# Patient Record
Sex: Male | Born: 1965 | Race: White | Hispanic: No | Marital: Married | Smoking: Former smoker
Health system: Southern US, Community
[De-identification: ages and names within clinical notes are randomized; demographics above are authoritative.]

## PROBLEM LIST (undated history)

## (undated) DIAGNOSIS — K219 Gastro-esophageal reflux disease without esophagitis: Secondary | ICD-10-CM

## (undated) DIAGNOSIS — I2510 Atherosclerotic heart disease of native coronary artery without angina pectoris: Secondary | ICD-10-CM

## (undated) DIAGNOSIS — I471 Supraventricular tachycardia, unspecified: Secondary | ICD-10-CM

## (undated) DIAGNOSIS — IMO0001 Reserved for inherently not codable concepts without codable children: Secondary | ICD-10-CM

## (undated) DIAGNOSIS — R030 Elevated blood-pressure reading, without diagnosis of hypertension: Secondary | ICD-10-CM

## (undated) DIAGNOSIS — E785 Hyperlipidemia, unspecified: Secondary | ICD-10-CM

## (undated) HISTORY — DX: Gastro-esophageal reflux disease without esophagitis: K21.9

## (undated) HISTORY — DX: Supraventricular tachycardia, unspecified: I47.10

## (undated) HISTORY — DX: Hyperlipidemia, unspecified: E78.5

## (undated) HISTORY — DX: Elevated blood-pressure reading, without diagnosis of hypertension: R03.0

## (undated) HISTORY — DX: Reserved for inherently not codable concepts without codable children: IMO0001

## (undated) HISTORY — DX: Supraventricular tachycardia: I47.1

## (undated) HISTORY — DX: Atherosclerotic heart disease of native coronary artery without angina pectoris: I25.10

---

## 1992-10-08 HISTORY — PX: CYSTOSCOPY W/ TRANSURETHRAL RESECTION OF POSTERIOR URETHERAL VALVES: SHX1428

## 1994-10-08 HISTORY — PX: HERNIA REPAIR: SHX51

## 2008-04-26 ENCOUNTER — Encounter: Payer: Self-pay | Admitting: Cardiology

## 2008-05-07 ENCOUNTER — Encounter: Payer: Self-pay | Admitting: Cardiology

## 2008-05-19 ENCOUNTER — Encounter: Payer: Self-pay | Admitting: Cardiology

## 2009-03-04 ENCOUNTER — Encounter: Payer: Self-pay | Admitting: Cardiology

## 2010-11-07 ENCOUNTER — Encounter: Payer: Self-pay | Admitting: Cardiology

## 2010-11-21 ENCOUNTER — Encounter: Payer: Self-pay | Admitting: Cardiology

## 2010-11-30 ENCOUNTER — Encounter: Payer: Self-pay | Admitting: Cardiology

## 2011-01-02 ENCOUNTER — Encounter: Payer: Self-pay | Admitting: Cardiology

## 2011-10-11 ENCOUNTER — Encounter: Payer: Self-pay | Admitting: *Deleted

## 2011-10-12 ENCOUNTER — Ambulatory Visit (INDEPENDENT_AMBULATORY_CARE_PROVIDER_SITE_OTHER): Payer: 59 | Admitting: Cardiology

## 2011-10-12 ENCOUNTER — Encounter: Payer: Self-pay | Admitting: Cardiology

## 2011-10-12 VITALS — BP 120/79 | HR 80 | Ht 73.0 in | Wt 250.0 lb

## 2011-10-12 DIAGNOSIS — I2510 Atherosclerotic heart disease of native coronary artery without angina pectoris: Secondary | ICD-10-CM

## 2011-10-12 DIAGNOSIS — E785 Hyperlipidemia, unspecified: Secondary | ICD-10-CM | POA: Insufficient documentation

## 2011-10-12 NOTE — Assessment & Plan Note (Signed)
Plan check lipids. Initiate therapy if needed.

## 2011-10-12 NOTE — Patient Instructions (Signed)
Your physician wants you to follow-up in: ONE YEAR You will receive a reminder letter in the mail two months in advance. If you don't receive a letter, please call our office to schedule the follow-up appointment.   Your physician recommends that you return for lab work in: WHEN ABLE= FASTING

## 2011-10-12 NOTE — Progress Notes (Signed)
  HPI: 46 year old male for establishment and management of hyperlipidemia, CAD and palpitations. Previously cared for in [place]. Previous echocardiogram in July of 2009 showed normal LV function. There was trace mitral and tricuspid regurgitation. Exercise treadmill in February of 2012 was normal. Cardiac CT in June of 2010 showed a score of 59.82. There was mild calcium in the LAD. The patient recently moved to [facility] and presents for evaluation. He denies dyspnea on exertion, orthopnea, PND, pedal edema, palpitations, syncope or exertional chest pain.  Current Outpatient Prescriptions  Medication Sig Dispense Refill  . aspirin 81 MG tablet Take 160 mg by mouth daily.        . Cholecalciferol (VITAMIN D-3 PO) Take 1 capsule by mouth daily.        . Flaxseed, Linseed, (FLAX SEEDS PO) Take 1 capsule by mouth daily.        . multivitamin (THERAGRAN) per tablet Take 1 tablet by mouth daily.        . Pyridoxine HCl (VITAMIN B6 PO) Take 1 tablet by mouth daily.          No Known Allergies  Past Medical History  Diagnosis Date  . High cholesterol   . CAD (coronary artery disease)     Mild calcium in LAD on previous CT  . GERD (gastroesophageal reflux disease)     Past Surgical History  Procedure Date  . Hernia repair 1996    Umbilical  . Cystoscopy w/ transurethral resection of posterior uretheral valves 1994    History   Social History  . Marital Status: Married    Spouse Name: N/A    Number of Children: 2  . Years of Education: N/A   Occupational History  .      Finance   Social History Main Topics  . Smoking status: Former Smoker    Types: Cigarettes    Quit date: 10/11/2004  . Smokeless tobacco: Former Neurosurgeon    Types: Snuff, Chew    Quit date: 10/11/2001  . Alcohol Use: Yes  . Drug Use: Not on file  . Sexually Active: Not on file   Other Topics Concern  . Not on file   Social History Narrative  . No narrative on file    Family History  Problem  Relation Age of Onset  . Hypertension Mother   . Thyroid disease Mother   . Coronary artery disease Father     Died at 62 of MI    ROS:  no fevers or chills, productive cough, hemoptysis, dysphasia, odynophagia, melena, hematochezia, dysuria, hematuria, rash, seizure activity, orthopnea, PND, pedal edema, claudication. Remaining systems are negative.  Physical Exam:  Blood pressure 120/79, pulse 80, height 6\' 1"  (1.854 m), weight 250 lb (113.399 kg).  General:  Well developed/well nourished in NAD Skin warm/dry Patient not depressed No peripheral clubbing Back-normal HEENT-normal/normal eyelids Neck supple/normal carotid upstroke bilaterally; no bruits; no JVD; no thyromegaly chest - CTA/ normal expansion CV - RRR/normal S1 and S2; no murmurs, rubs or gallops;  PMI nondisplaced Abdomen -NT/ND, no HSM, no mass, + bowel sounds, no bruit 2+ femoral pulses, no bruits Ext-no edema, chords, 2+ DP Neuro-grossly nonfocal  ECG NSR with no ST changes

## 2011-10-12 NOTE — Assessment & Plan Note (Signed)
Patient with no cardiac symptoms. Previous cardiac CT showed mild calcium in the LAD. Continue aspirin. Note recent exercise treadmill negative. Previous echo showed normal LV function. We discussed the importance of risk factor modification including diet, weight loss and exercise. He does not smoke.

## 2011-10-16 ENCOUNTER — Other Ambulatory Visit: Payer: Self-pay | Admitting: Cardiology

## 2011-10-16 ENCOUNTER — Other Ambulatory Visit (INDEPENDENT_AMBULATORY_CARE_PROVIDER_SITE_OTHER): Payer: 59 | Admitting: *Deleted

## 2011-10-16 DIAGNOSIS — I2510 Atherosclerotic heart disease of native coronary artery without angina pectoris: Secondary | ICD-10-CM

## 2011-10-16 LAB — LIPID PANEL
Cholesterol: 280 mg/dL — ABNORMAL HIGH (ref 0–200)
Triglycerides: 148 mg/dL (ref 0.0–149.0)

## 2011-10-17 ENCOUNTER — Telehealth: Payer: Self-pay | Admitting: *Deleted

## 2011-10-17 DIAGNOSIS — E7800 Pure hypercholesterolemia, unspecified: Secondary | ICD-10-CM

## 2011-10-17 DIAGNOSIS — Z79899 Other long term (current) drug therapy: Secondary | ICD-10-CM

## 2011-10-17 MED ORDER — ROSUVASTATIN CALCIUM 40 MG PO TABS: 40.0000 mg | ORAL_TABLET | Freq: Every day | ORAL | Status: DC

## 2011-10-17 NOTE — Telephone Encounter (Signed)
Message copied by Freddi Starr on Wed Oct 17, 2011  8:43 AM ------      Message from: Lewayne Bunting      Created: Tue Oct 16, 2011  1:47 PM       Add crestor 40 mg daily; lipids and liver in six weeks      Daniel Koch

## 2011-11-05 ENCOUNTER — Telehealth: Payer: Self-pay | Admitting: Cardiology

## 2011-11-05 DIAGNOSIS — E7800 Pure hypercholesterolemia, unspecified: Secondary | ICD-10-CM

## 2011-11-05 DIAGNOSIS — Z79899 Other long term (current) drug therapy: Secondary | ICD-10-CM

## 2011-11-05 NOTE — Telephone Encounter (Signed)
Spoke with pt, he is having insomnia since starting the crestor. He would like to change back to lipitor, he has taken before with no problems. Will forward for dr Jens Som review

## 2011-11-05 NOTE — Telephone Encounter (Signed)
New Problem:    Patient called in wondering if he could switch his rosuvastatin (CRESTOR) 40 MG tablet to another medication because he is experiencing some weird side effects. Please call back.

## 2011-11-05 NOTE — Telephone Encounter (Signed)
Dc crestor; lipitor 40 mg daily; lipids and liver in six weeks Deshan Claude Waldman  

## 2011-11-06 MED ORDER — ATORVASTATIN CALCIUM 40 MG PO TABS: 40.0000 mg | ORAL_TABLET | Freq: Every day | ORAL | Status: DC

## 2011-11-06 NOTE — Telephone Encounter (Signed)
Spoke with pt, aware of med change. Labs to be done on 12-18-11

## 2011-12-05 ENCOUNTER — Other Ambulatory Visit: Payer: 59 | Admitting: *Deleted

## 2011-12-18 ENCOUNTER — Other Ambulatory Visit: Payer: 59

## 2011-12-18 DIAGNOSIS — E7800 Pure hypercholesterolemia, unspecified: Secondary | ICD-10-CM

## 2011-12-18 DIAGNOSIS — Z79899 Other long term (current) drug therapy: Secondary | ICD-10-CM

## 2011-12-18 LAB — LIPID PANEL
Cholesterol: 128 mg/dL (ref 0–200)
LDL Cholesterol: 59 mg/dL (ref 0–99)
Total CHOL/HDL Ratio: 2.5 Ratio
VLDL: 18 mg/dL (ref 0–40)

## 2011-12-19 ENCOUNTER — Encounter: Payer: Self-pay | Admitting: *Deleted

## 2011-12-19 LAB — HEPATIC FUNCTION PANEL
ALT: 28 U/L (ref 0–53)
AST: 26 U/L (ref 0–37)
Bilirubin, Direct: 0.3 mg/dL (ref 0.0–0.3)
Indirect Bilirubin: 0 mg/dL (ref 0.0–0.9)
Total Bilirubin: 0.3 mg/dL (ref 0.3–1.2)

## 2012-01-02 ENCOUNTER — Other Ambulatory Visit: Payer: Self-pay | Admitting: *Deleted

## 2012-01-02 MED ORDER — ATORVASTATIN CALCIUM 40 MG PO TABS: 40.0000 mg | ORAL_TABLET | Freq: Every day | ORAL | Status: DC

## 2012-01-08 ENCOUNTER — Telehealth: Payer: Self-pay | Admitting: Cardiology

## 2012-01-08 MED ORDER — NITROGLYCERIN 0.4 MG SL SUBL: 0.4000 mg | SUBLINGUAL_TABLET | SUBLINGUAL | Status: AC | PRN

## 2012-01-08 NOTE — Telephone Encounter (Signed)
New msg Pt said he has been having some anxiety and slight chest pain, no sob. Please call back

## 2012-01-08 NOTE — Telephone Encounter (Signed)
Patient called, stated he woke up this morning having mid to left sided chest pain.States pain comes and goes.States each episode lasting appox 30 seconds.No sob,feels a little dizzy.States no chest pain at present.Spoke to Norma Fredrickson NP she advised NTG sl 0.4 mg as needed. Appointment given with Norma Fredrickson NP 01/09/12 at 3:15 pm.Advised to go to ER if needed.

## 2012-01-09 ENCOUNTER — Ambulatory Visit (INDEPENDENT_AMBULATORY_CARE_PROVIDER_SITE_OTHER): Payer: 59 | Admitting: Nurse Practitioner

## 2012-01-09 ENCOUNTER — Encounter: Payer: Self-pay | Admitting: Nurse Practitioner

## 2012-01-09 DIAGNOSIS — IMO0001 Reserved for inherently not codable concepts without codable children: Secondary | ICD-10-CM | POA: Insufficient documentation

## 2012-01-09 DIAGNOSIS — R030 Elevated blood-pressure reading, without diagnosis of hypertension: Secondary | ICD-10-CM

## 2012-01-09 DIAGNOSIS — R079 Chest pain, unspecified: Secondary | ICD-10-CM | POA: Insufficient documentation

## 2012-01-09 DIAGNOSIS — E785 Hyperlipidemia, unspecified: Secondary | ICD-10-CM

## 2012-01-09 NOTE — Patient Instructions (Signed)
We are going to arrange for treadmill test  Stay on your current medicines.

## 2012-01-09 NOTE — Assessment & Plan Note (Signed)
Blood pressure is elevated today. Not normally this high. He is agreeable to monitoring and will bring in some readings for review. If his blood pressure remains elevated, then we will start antihypertensive and lifestyle changes.

## 2012-01-09 NOTE — Assessment & Plan Note (Signed)
Patient presents with atypical chest pain. Does not sound cardiac but has multiple risk factors including gender, HLD, known calcium in the LAD and obesity. EKG is abnormal but unchanged. Will proceed with repeat GXT. If he does well, risk factor modification will be strongly encouraged. Patient is agreeable to this plan and will call if any problems develop in the interim.

## 2012-01-09 NOTE — Progress Notes (Signed)
Daniel Koch Date of Birth: [birth year] Medical Record #[id number]  History of Present Illness: Daniel Koch is seen back today for a work in visit. He is seen for Dr. Jens Som. He is a very pleasant 46 year old male with a history of HLD and mild calcium in his LAD per cardiac CT in June of 2010. He has had a negative GXT back in February of 2012. Past echo in 2009 showed normal LV function with trace MR and TR. His aortic root was normal.   He comes in today. He called yesterday to report that for the past several days, he has been having a sharp discomfort in the center of his chest. It is not exertional related. He has no other associated symptoms. It is not steady. Only lasts for 30 to 60 seconds. He called to be seen because while his chest pain was not getting worse, it was not getting better. He does not seem to be anxious. No unusual stress. He has stopped exercising. Blood pressure is up today. Has not been checking at home. Does get dizzy on occasion.   In talking with him regarding his history, he has learned that his biological father died at age 44 of an aortic dissection. Daniel Koch was adopted. His paternal uncle died young as well but Daniel Koch does not know what the cause was. He has had his labs last month. Cholesterol levels are excellent.   Current Outpatient Prescriptions on File Prior to Visit  Medication Sig Dispense Refill  . aspirin 81 MG tablet Take 160 mg by mouth daily.        Marland Kitchen atorvastatin (LIPITOR) 40 MG tablet Take 1 tablet (40 mg total) by mouth daily.  90 tablet  3  . Cholecalciferol (VITAMIN D-3 PO) Take 1 capsule by mouth daily.        . Flaxseed, Linseed, (FLAX SEEDS PO) Take 1 capsule by mouth daily.        . multivitamin (THERAGRAN) per tablet Take 1 tablet by mouth daily.        . nitroGLYCERIN (NITROSTAT) 0.4 MG SL tablet Place 1 tablet (0.4 mg total) under the tongue every 5 (five) minutes as needed for chest pain.  25 tablet  6  . Pyridoxine HCl (VITAMIN B6 PO) Take 1  tablet by mouth daily.          No Known Allergies  Past Medical History  Diagnosis Date  . High cholesterol   . CAD (coronary artery disease)     Mild calcium in LAD on previous CT; negative ETT in Feb 2012  . GERD (gastroesophageal reflux disease)     Past Surgical History  Procedure Date  . Hernia repair 1996    Umbilical  . Cystoscopy w/ transurethral resection of posterior uretheral valves 1994    History  Smoking status  . Former Smoker  . Types: Cigarettes  . Quit date: 10/11/2004  Smokeless tobacco  . Former Neurosurgeon  . Types: Snuff, Chew  . Quit date: 10/11/2001    History  Alcohol Use  . Yes    Family History  Problem Relation Age of Onset  . Adopted: Yes  . Hypertension Mother   . Thyroid disease Mother   . Coronary artery disease Father     Died at 28 of MI    Review of Systems: The review of systems is per the HPI.  All other systems were reviewed and are negative.  Physical Exam: BP 122/98  Pulse 65  Ht 6'  1" (1.854 m)  Wt 246 lb (111.585 kg)  BMI 32.46 kg/m2 Patient is very pleasant and in no acute distress. He is obese.  Skin is warm and dry. Color is normal.  HEENT is unremarkable. Normocephalic/atraumatic. PERRL. Sclera are nonicteric. Neck is supple. No masses. No JVD. Lungs are clear. Cardiac exam shows a regular rate and rhythm. Abdomen is obese but soft. Extremities are without edema. Gait and ROM are intact. No gross neurologic deficits noted.   LABORATORY DATA: EKG today shows sinus rhythm. He does have inferior Q's but these were present on his past tracing.    Assessment / Plan:

## 2012-01-09 NOTE — Assessment & Plan Note (Signed)
Lipids last month looked great. No change in his current medicines.

## 2012-01-14 ENCOUNTER — Ambulatory Visit: Payer: 59 | Admitting: Nurse Practitioner

## 2012-01-30 ENCOUNTER — Ambulatory Visit (INDEPENDENT_AMBULATORY_CARE_PROVIDER_SITE_OTHER): Payer: 59 | Admitting: Nurse Practitioner

## 2012-01-30 ENCOUNTER — Encounter: Payer: Self-pay | Admitting: Nurse Practitioner

## 2012-01-30 DIAGNOSIS — I471 Supraventricular tachycardia: Secondary | ICD-10-CM

## 2012-01-30 DIAGNOSIS — R079 Chest pain, unspecified: Secondary | ICD-10-CM

## 2012-01-30 DIAGNOSIS — IMO0001 Reserved for inherently not codable concepts without codable children: Secondary | ICD-10-CM

## 2012-01-30 MED ORDER — METOPROLOL SUCCINATE ER 25 MG PO TB24: 25.0000 mg | ORAL_TABLET | Freq: Every day | ORAL | Status: DC

## 2012-01-30 NOTE — Assessment & Plan Note (Signed)
His GXT is negative for ischemia today. Have advised risk factor modification.

## 2012-01-30 NOTE — Assessment & Plan Note (Signed)
Blood pressure has been better at home per his report. I have asked him to continue to monitor.

## 2012-01-30 NOTE — Procedures (Signed)
Exercise Treadmill Test  Pre-Exercise Testing Evaluation Rhythm: normal sinus  Rate: 77   PR:  .14 QRS:  .09  QT:  .37 QTc: .41     Test  Exercise Tolerance Test Ordering MD: Olga Millers, MD  Interpreting MD:  Norma Fredrickson , NP  Unique Test No: 1  Treadmill:  1  Indication for ETT: chest pain - rule out ischemia  Contraindication to ETT: No   Stress Modality: exercise - treadmill  Cardiac Imaging Performed: non   Protocol: standard Bruce - maximal  Max BP:  181/62  Max MPHR (bpm):  177 85% MPR (bpm):  150  MPHR obtained (bpm):  226 % MPHR obtained: 127%  Reached 85% MPHR (min:sec): 9:55 Total Exercise Time (min-sec):  12:36  Workload in METS:  14.5 Borg Scale: 14  Reason ETT Terminated:  SVT    ST Segment Analysis At Rest: normal ST segments - no evidence of significant ST depression With Exercise: no evidence of significant ST depression  Other Information Arrhythmia:  Yes Angina during ETT:  absent (0) Quality of ETT:  diagnostic  ETT Interpretation:  normal - no evidence of ischemia by ST analysis  Comments: Patient exercised on the standard Bruce protocol for a total of 12:36.  Excellent exercise tolerance.  Adequate blood pressure response Clinically negative EKG negative for ischemia but with run of SVT at a rate of 226 at the peak of exercise. This resolved quickly in recovery and the test was terminated.   Recommendations: Have reviewed these tracings with Dr. Jens Som. This is felt to be SVT. Will start Toprol XL 25 mg daily. Will see him back in one month. The test was negative for ischemia.

## 2012-01-30 NOTE — Assessment & Plan Note (Signed)
Patient presents for GXT today. Noted to have SVT at the peak of exercise at a rate of 126. Will start low dose beta blocker. Will see him back in one month. He is to call for any recurrent dizzy spells.

## 2012-01-30 NOTE — Patient Instructions (Signed)
  You passed your stress test today but did have some fast heart beating that we would like to try and prevent.  We are going to start you on Toprol XL 25 mg each day. This will slow your heart rate down and will bring your blood pressure down some.  Continue to monitor your blood pressure at home.  You may start your walking program.   We will see you in a month.

## 2012-01-30 NOTE — Progress Notes (Signed)
   Daniel Koch Date of Birth: Sep 06, 1966 Medical Record #[id number]  History of Present Illness: Daniel Koch is seen back today for a GXT. He is seen for Dr. Jens Som. He is a very pleasant 46 year old male with HLD and mild calcium in his LAD per a cardiac CT in June of 2010. He had a negative ETT last February. I saw him earlier this month with some atypical chest pain and dizziness. He was referred for repeat stress testing. His blood pressure was elevated at that visit.  He comes in today. He is here alone. Says he is doing well. Has had one transient episode of dizziness. Can feel his heart beating hard but not necessarily fast. Blood pressure at home has been good. He is anxious to start an exercise program.   Current Outpatient Prescriptions on File Prior to Visit  Medication Sig Dispense Refill  . aspirin 81 MG tablet Take 160 mg by mouth daily.        Marland Kitchen atorvastatin (LIPITOR) 40 MG tablet Take 1 tablet (40 mg total) by mouth daily.  90 tablet  3  . Cholecalciferol (VITAMIN D-3 PO) Take 1 capsule by mouth daily.        . Flaxseed, Linseed, (FLAX SEEDS PO) Take 1 capsule by mouth daily.        . metoprolol succinate (TOPROL XL) 25 MG 24 hr tablet Take 1 tablet (25 mg total) by mouth daily.  30 tablet  11  . multivitamin (THERAGRAN) per tablet Take 1 tablet by mouth daily.        . nitroGLYCERIN (NITROSTAT) 0.4 MG SL tablet Place 1 tablet (0.4 mg total) under the tongue every 5 (five) minutes as needed for chest pain.  25 tablet  6  . Pyridoxine HCl (VITAMIN B6 PO) Take 1 tablet by mouth daily.          No Known Allergies  Past Medical History  Diagnosis Date  . High cholesterol   . CAD (coronary artery disease)     Mild calcium in LAD on previous CT; negative ETT in Feb 2012  . GERD (gastroesophageal reflux disease)     Past Surgical History  Procedure Date  . Hernia repair 1996    Umbilical  . Cystoscopy w/ transurethral resection of posterior uretheral valves 1994    History    Smoking status  . Former Smoker  . Types: Cigarettes  . Quit date: 10/11/2004  Smokeless tobacco  . Former Neurosurgeon  . Types: Snuff, Chew  . Quit date: 10/11/2001    History  Alcohol Use  . Yes    Family History  Problem Relation Age of Onset  . Adopted: Yes  . Hypertension Mother   . Thyroid disease Mother   . Coronary artery disease Father     Died at 32 of MI    Review of Systems: The review of systems is per the HPI.  All other systems were reviewed and are negative.  Physical Exam: BP 138/81  Pulse 77  Ht 6\' 1"  (1.854 m)  Wt 246 lb (111.585 kg)  BMI 32.46 kg/m2  LABORATORY DATA:   Assessment / Plan:

## 2012-02-28 ENCOUNTER — Encounter: Payer: Self-pay | Admitting: *Deleted

## 2012-03-04 ENCOUNTER — Ambulatory Visit (INDEPENDENT_AMBULATORY_CARE_PROVIDER_SITE_OTHER): Payer: 59 | Admitting: Cardiology

## 2012-03-04 ENCOUNTER — Encounter: Payer: Self-pay | Admitting: Cardiology

## 2012-03-04 DIAGNOSIS — I471 Supraventricular tachycardia: Secondary | ICD-10-CM

## 2012-03-04 DIAGNOSIS — I498 Other specified cardiac arrhythmias: Secondary | ICD-10-CM

## 2012-03-04 DIAGNOSIS — E785 Hyperlipidemia, unspecified: Secondary | ICD-10-CM

## 2012-03-04 DIAGNOSIS — I2510 Atherosclerotic heart disease of native coronary artery without angina pectoris: Secondary | ICD-10-CM

## 2012-03-04 DIAGNOSIS — R079 Chest pain, unspecified: Secondary | ICD-10-CM

## 2012-03-04 DIAGNOSIS — R030 Elevated blood-pressure reading, without diagnosis of hypertension: Secondary | ICD-10-CM

## 2012-03-04 NOTE — Assessment & Plan Note (Signed)
Minimal based on cardiac CT. Continue aspirin and statin.

## 2012-03-04 NOTE — Assessment & Plan Note (Signed)
Blood pressure controlled. Continue present medications. 

## 2012-03-04 NOTE — Progress Notes (Signed)
   HPI: 46 year old male for fu of hyperlipidemia, CAD and palpitations. Previously cared for in [place]. Previous echocardiogram in July of 2009 showed normal LV function. There was trace mitral and tricuspid regurgitation. Exercise treadmill in February of 2012 was normal. Cardiac CT in June of 2010 showed a score of 59.82. There was mild calcium in the LAD. Patient recently seen by Norma Fredrickson for atypical CP; had ETT 4/13 with no ischemia; brief SVT with exercise and started on beta blocker. Since then, he denies dyspnea on exertion, orthopnea, PND, pedal edema, palpitations, syncope or exertional chest pain.   Current Outpatient Prescriptions  Medication Sig Dispense Refill  . aspirin 81 MG tablet Take 160 mg by mouth daily.        Marland Kitchen atorvastatin (LIPITOR) 40 MG tablet Take 1 tablet (40 mg total) by mouth daily.  90 tablet  3  . Cholecalciferol (VITAMIN D-3 PO) Take 1 capsule by mouth daily.        . Flaxseed, Linseed, (FLAX SEEDS PO) Take 1 capsule by mouth daily.        . metoprolol succinate (TOPROL XL) 25 MG 24 hr tablet Take 1 tablet (25 mg total) by mouth daily.  30 tablet  11  . multivitamin (THERAGRAN) per tablet Take 1 tablet by mouth daily.        . nitroGLYCERIN (NITROSTAT) 0.4 MG SL tablet Place 1 tablet (0.4 mg total) under the tongue every 5 (five) minutes as needed for chest pain.  25 tablet  6  . Pyridoxine HCl (VITAMIN B6 PO) Take 1 tablet by mouth daily.           Past Medical History  Diagnosis Date  . CAD (coronary artery disease)   . GERD (gastroesophageal reflux disease)   . Hyperlipidemia   . Chest pain   . Elevated blood pressure   . SVT (supraventricular tachycardia)     Past Surgical History  Procedure Date  . Hernia repair 1996    Umbilical  . Cystoscopy w/ transurethral resection of posterior uretheral valves 1994    History   Social History  . Marital Status: Married    Spouse Name: N/A    Number of Children: 2  . 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 Smoking status: Former Smoker    Types: Cigarettes    Quit date: 10/11/2004  . Smokeless tobacco: Former Neurosurgeon    Types: Snuff, Chew    Quit date: 10/11/2001  . Alcohol Use: Yes  . Drug Use: No  . Sexually Active: Yes   Other Topics Concern  . Not on file   Social History Narrative  . No narrative on file    ROS: no fevers or chills, productive cough, hemoptysis, dysphasia, odynophagia, melena, hematochezia, dysuria, hematuria, rash, seizure activity, orthopnea, PND, pedal edema, claudication. Remaining systems are negative.  Physical Exam: Well-developed well-nourished in no acute distress.  Skin is warm and dry.  HEENT is normal.  Neck is supple.  Chest is clear to auscultation with normal expansion.  Cardiovascular exam is regular rate and rhythm.  Abdominal exam nontender or distended. No masses palpated. Extremities show no edema. neuro grossly intact

## 2012-03-04 NOTE — Assessment & Plan Note (Signed)
No further symptoms. Exercise treadmill negative. Continue risk factor modification.

## 2012-03-04 NOTE — Assessment & Plan Note (Signed)
Brief runs of SVT on exercise treadmill. Continue beta blocker.

## 2012-03-04 NOTE — Assessment & Plan Note (Signed)
Continue statin. 

## 2012-03-04 NOTE — Patient Instructions (Signed)
Your physician wants you to follow-up in: ONE YEAR WITH DR CRENSHAW You will receive a reminder letter in the mail two months in advance. If you don't receive a letter, please call our office to schedule the follow-up appointment.  

## 2013-01-02 ENCOUNTER — Other Ambulatory Visit: Payer: Self-pay | Admitting: Cardiology

## 2013-03-20 ENCOUNTER — Encounter: Payer: Self-pay | Admitting: Cardiology

## 2013-05-13 ENCOUNTER — Other Ambulatory Visit: Payer: Self-pay | Admitting: Gastroenterology

## 2013-05-13 DIAGNOSIS — R109 Unspecified abdominal pain: Secondary | ICD-10-CM

## 2013-05-19 ENCOUNTER — Ambulatory Visit
Admission: RE | Admit: 2013-05-19 | Discharge: 2013-05-19 | Disposition: A | Discharge status: Home / Self Care | Payer: 59 | Source: Ambulatory Visit | Attending: Gastroenterology | Admitting: Gastroenterology

## 2013-05-19 DIAGNOSIS — R109 Unspecified abdominal pain: Secondary | ICD-10-CM

## 2014-02-03 ENCOUNTER — Other Ambulatory Visit: Payer: Self-pay | Admitting: Cardiology

## 2014-04-23 ENCOUNTER — Ambulatory Visit (INDEPENDENT_AMBULATORY_CARE_PROVIDER_SITE_OTHER): Payer: 59 | Admitting: Nurse Practitioner

## 2014-04-23 ENCOUNTER — Encounter: Payer: Self-pay | Admitting: Nurse Practitioner

## 2014-04-23 ENCOUNTER — Other Ambulatory Visit: Payer: Self-pay | Admitting: *Deleted

## 2014-04-23 VITALS — BP 110/84 | HR 76 | Ht 73.0 in | Wt 249.0 lb

## 2014-04-23 DIAGNOSIS — I2584 Coronary atherosclerosis due to calcified coronary lesion: Secondary | ICD-10-CM

## 2014-04-23 DIAGNOSIS — I2510 Atherosclerotic heart disease of native coronary artery without angina pectoris: Secondary | ICD-10-CM

## 2014-04-23 DIAGNOSIS — E785 Hyperlipidemia, unspecified: Secondary | ICD-10-CM

## 2014-04-23 NOTE — Progress Notes (Signed)
Daniel Koch Date of Birth: 11/10/1965 Medical Record #[id number]  History of Present Illness: Mr. Llana AlimentMcArdle is seen back today for a follow up visit. Seen for Dr. Jens Somrenshaw. He has HLD, CAD and palpitations. Has normal LV function by remote echo. Cardiac Ct in June of 2010 showed a score of 59.82 with mild calcium in the LAD.   Seen by myself for atypical chest pain and had negative GXT in April of 2013.  Last seen here in May of 2013 by Dr. Jens Somrenshaw. Has not been here since.   Comes back today. Here alone. He is doing well. No chest pain. Not short of breath. Not smoking. No palpitations. Tolerating his medicines. PCP checks his labs. He is exercising regularly. Feels good and is happy with how he feels. No longer on his beta blocker - doesn't really remember why he is not taking.    Current Outpatient Prescriptions  Medication Sig Dispense Refill  . atorvastatin (LIPITOR) 40 MG tablet TAKE 1 TABLET DAILY  30 tablet  0  . lansoprazole (PREVACID) 15 MG capsule Take 15 mg by mouth.      . nitroGLYCERIN (NITROSTAT) 0.4 MG SL tablet Place 1 tablet (0.4 mg total) under the tongue every 5 (five) minutes as needed for chest pain.  25 tablet  6  . Testosterone (ANDROGEL PUMP) 20.25 MG/ACT (1.62%) GEL APPLY4 PUMPS ONTO THE SKIN DAILY      . metoprolol succinate (TOPROL XL) 25 MG 24 hr tablet Take 1 tablet (25 mg total) by mouth daily.  30 tablet  11   No current facility-administered medications for this visit.    No Known Allergies  Past Medical History  Diagnosis Date  . CAD (coronary artery disease)   . GERD (gastroesophageal reflux disease)   . Hyperlipidemia   . Chest pain   . Elevated blood pressure   . SVT (supraventricular tachycardia)     Past Surgical History  Procedure Laterality Date  . Hernia repair  1996    Umbilical  . Cystoscopy w/ transurethral resection of posterior uretheral valves  1994    History  Smoking status  . Former Smoker  . Types: Cigarettes  . Quit  date: 10/11/2004  Smokeless tobacco  . Former NeurosurgeonUser  . Types: Snuff, Chew  . Quit date: 10/11/2001    History  Alcohol Use  . Yes    Family History  Problem Relation Age of Onset  . Adopted: Yes  . Hypertension Mother   . Thyroid disease Mother   . Coronary artery disease Father     Died at 5255 of MI    Review of Systems: The review of systems is per the HPI.  All other systems were reviewed and are negative.  Physical Exam: BP 110/84  Pulse 76  Ht 6\' 1"  (1.854 m)  Wt 249 lb (112.946 kg)  BMI 32.86 kg/m2 Patient is very pleasant and in no acute distress. Skin is warm and dry. Color is normal.  HEENT is unremarkable. Normocephalic/atraumatic. PERRL. Sclera are nonicteric. Neck is supple. No masses. No JVD. Lungs are clear. Cardiac exam shows a regular rate and rhythm. Abdomen is soft. Extremities are without edema. Gait and ROM are intact. No gross neurologic deficits noted.  Wt Readings from Last 3 Encounters:  04/23/14 249 lb (112.946 kg)  03/04/12 250 lb (113.399 kg)  01/30/12 246 lb (111.585 kg)    LABORATORY DATA/PROCEDURES:  Lab Results  Component Value Date   CHOL 128 12/18/2011   TRIG 88  12/18/2011   HDL 51 12/18/2011   LDLDIRECT 182.8 10/16/2011   LDLCALC 59 12/18/2011   ALT 28 12/18/2011   AST 26 12/18/2011    BNP (last 3 results) No results found for this basename: PROBNP,  in the last 8760 hours   Assessment / Plan: 1. CAD - has had coronary calcification of the LAD noted - continues with CV risk factor modification  2. SVT - remote episode while having GXT - no longer on his beta blocker - no palpitations.   3. HLD - on statin therapy - labs checked by PCP  4. Obesity - needs to continue to work on his diet/exercise/weight loss  5. Past tobacco abuse - resolved.  Patient is agreeable to this plan and will call if any problems develop in the interim.   Rosalio Macadamia, RN, ANP-C [facility] [address] [place], [place]  [address] [phone]

## 2014-04-23 NOTE — Patient Instructions (Signed)
We will get a GXT in about 6 months  Stay on current medicines  Call the [facility] office at [phone] if you have any questions, problems or concerns.

## 2014-09-14 ENCOUNTER — Other Ambulatory Visit: Payer: Self-pay | Admitting: Cardiology

## 2014-10-29 ENCOUNTER — Encounter: Payer: 59 | Admitting: Nurse Practitioner

## 2014-12-01 ENCOUNTER — Encounter: Payer: Self-pay | Admitting: Nurse Practitioner

## 2015-01-10 ENCOUNTER — Encounter: Payer: Self-pay | Admitting: Physician Assistant

## 2015-01-31 ENCOUNTER — Ambulatory Visit (INDEPENDENT_AMBULATORY_CARE_PROVIDER_SITE_OTHER): Payer: 59 | Admitting: Physician Assistant

## 2015-01-31 DIAGNOSIS — E785 Hyperlipidemia, unspecified: Secondary | ICD-10-CM

## 2015-01-31 DIAGNOSIS — I2584 Coronary atherosclerosis due to calcified coronary lesion: Secondary | ICD-10-CM | POA: Diagnosis not present

## 2015-01-31 DIAGNOSIS — I2510 Atherosclerotic heart disease of native coronary artery without angina pectoris: Secondary | ICD-10-CM

## 2015-01-31 NOTE — Progress Notes (Signed)
Exercise Treadmill Test  Pre-Exercise Testing Evaluation Rhythm: normal sinus  Rate: 69     Test  Exercise Tolerance Test Ordering MD: Olga MillersBrian Crenshaw, MD  Interpreting MD: Tereso NewcomerScott Krysten Veronica, PA-C  Unique Test No: 1  Treadmill:  1  Indication for ETT: Coronary Artery Calcification, Hyperlipidemia  Contraindication to ETT: No   Stress Modality: exercise - treadmill  Cardiac Imaging Performed: non   Protocol: standard Bruce - maximal  Max BP:  223/64  Max MPHR (bpm):  174 85% MPR (bpm):  148  MPHR obtained (bpm):  150 % MPHR obtained:  86  Reached 85% MPHR (min:sec):  10:24 Total Exercise Time (min-sec):  12:00  Workload in METS:  13.4 Borg Scale: 14  Reason ETT Terminated:  desired heart rate attained    ST Segment Analysis At Rest: normal ST segments - no evidence of significant ST depression With Exercise: non-specific ST changes  Other Information Arrhythmia:  No Angina during ETT:  absent (0) Quality of ETT:  diagnostic  ETT Interpretation:  normal - no evidence of ischemia by ST analysis  Comments: Excellent exercise capacity. No chest pain. Normal BP response to exercise. No ST changes to suggest ischemia.   Recommendations: FU with Dr. Olga MillersBrian Crenshaw or Norma FredricksonLori Gerhardt, NP as planned. Signed,  Tereso NewcomerScott Ellington Cornia, PA-C   01/31/2015 11:33 AM

## 2015-02-02 NOTE — Progress Notes (Signed)
pt aware of results  

## 2015-03-14 IMAGING — US US ABDOMEN COMPLETE
1 series · 14 of 25 positions shown · non-contrast
Comparison: none

Abdominal ultrasound
HISTORY: Abdominal pain and hypercholesterolemia.

[Series 1: us abdomen complete · 0.30mm/px · 14 of 69 slices shown]
[im 1/69]
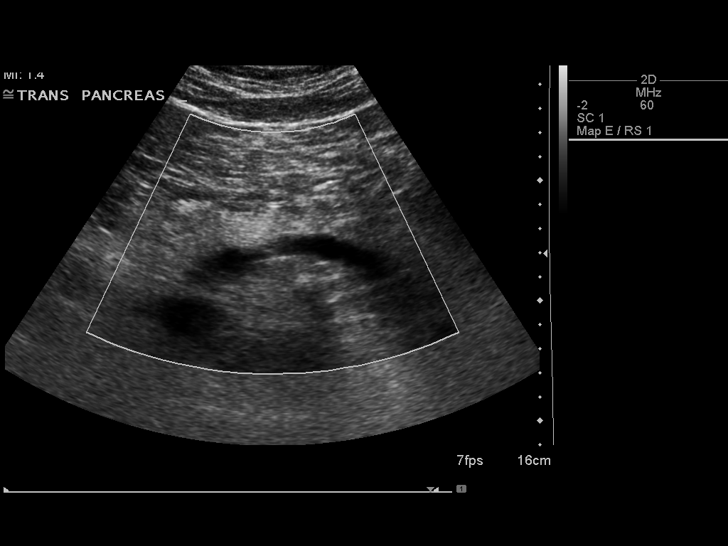
[im 6/69]
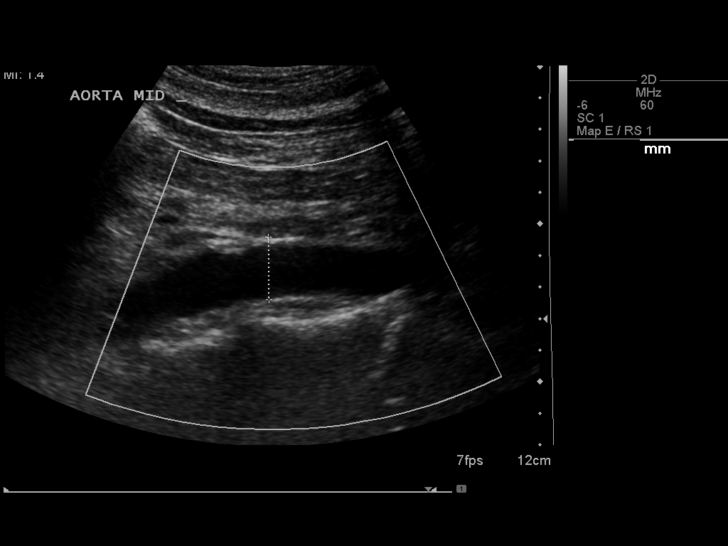
[im 12/69]
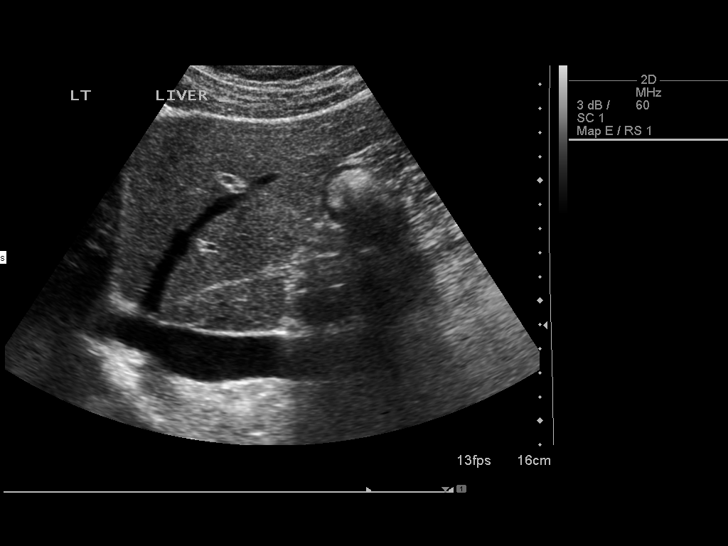
[im 18/69]
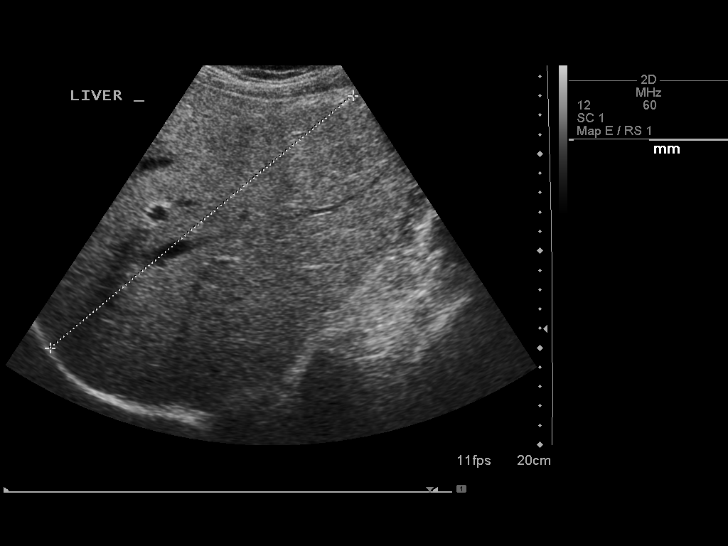
[im 23/69]
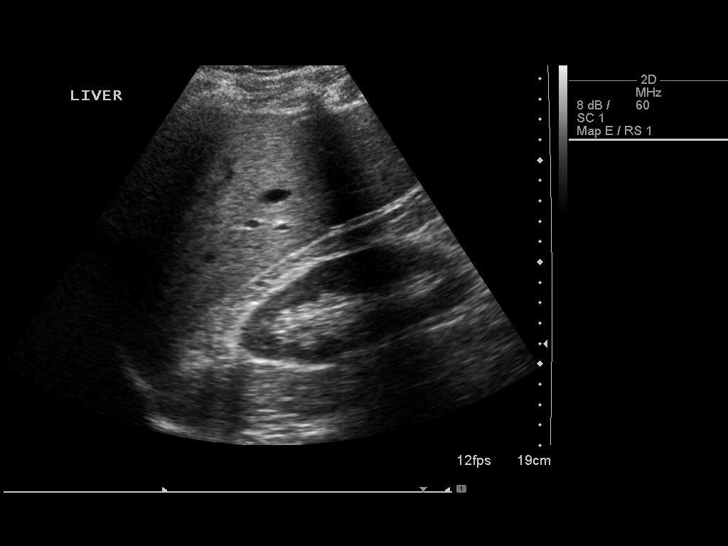
[im 26/69]
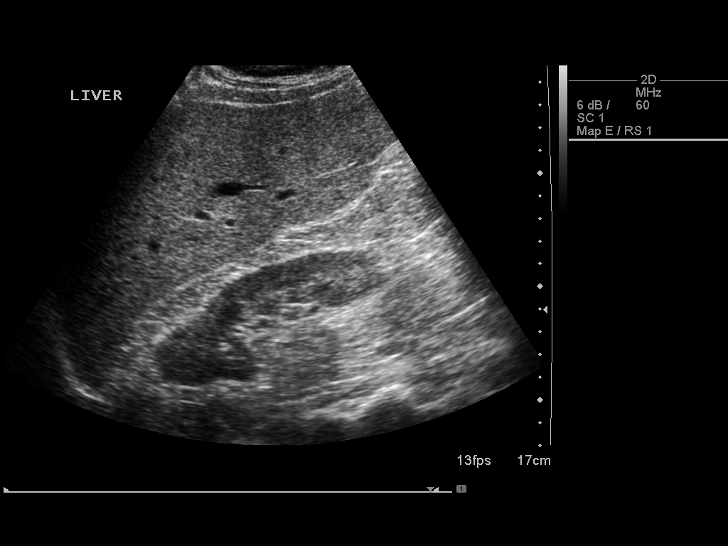
[im 32/69]
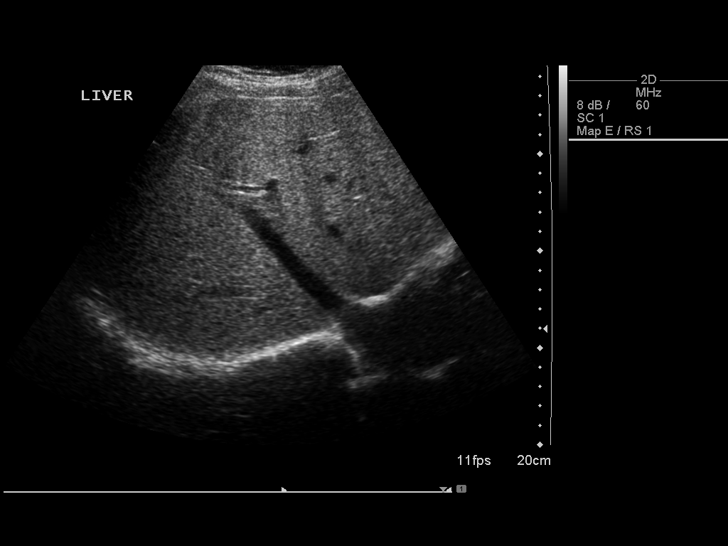
[im 37/69]
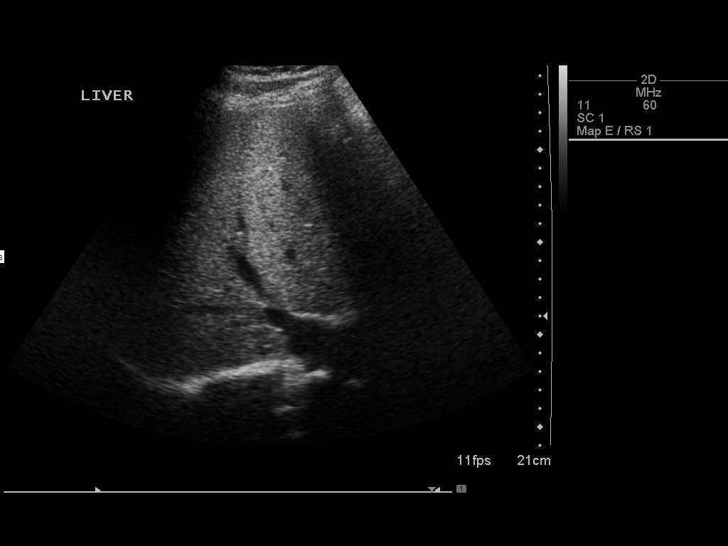
[im 43/69]
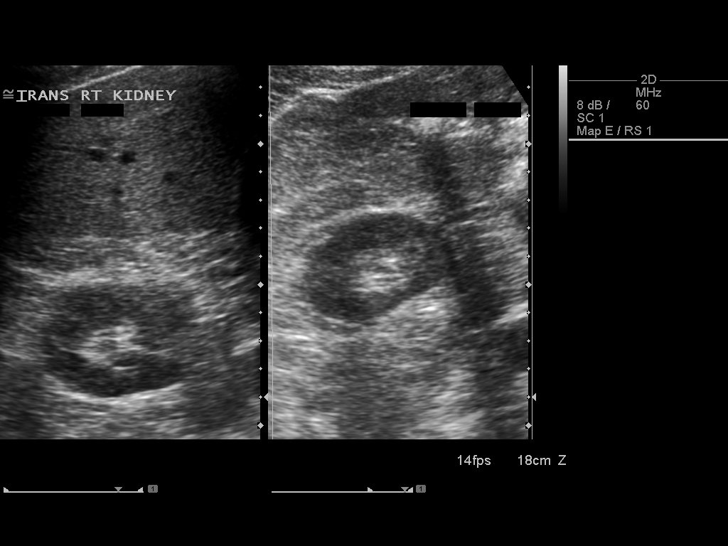
[im 46/69]
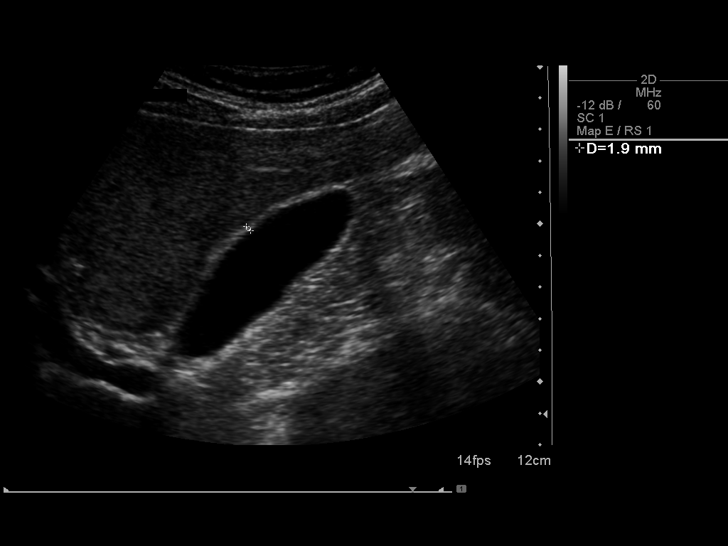
[im 52/69]
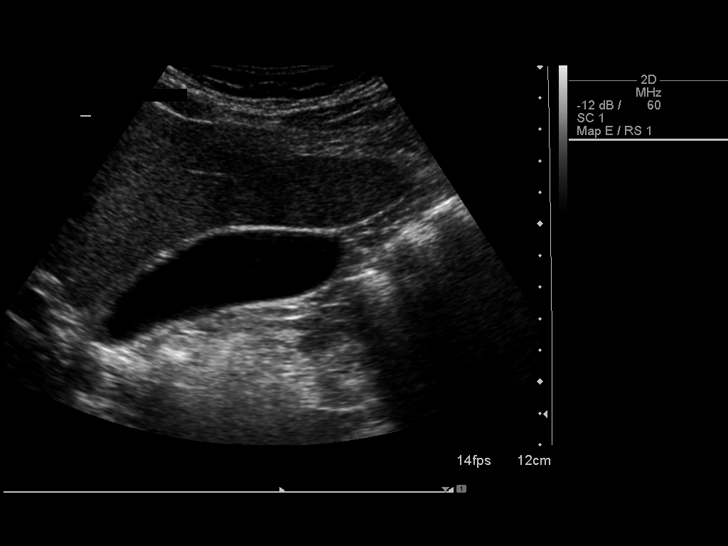
[im 57/69]
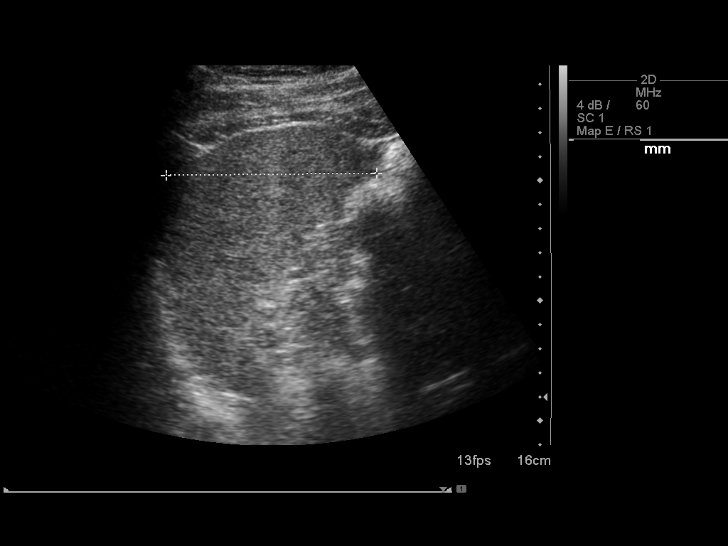
[im 63/69]
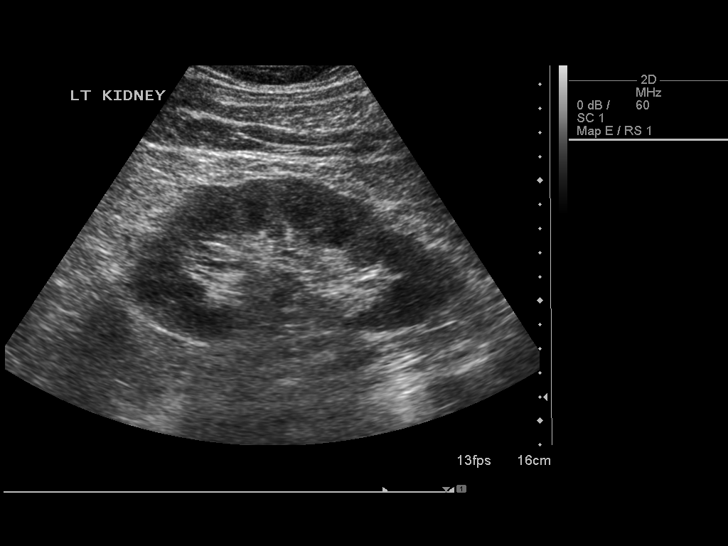
[im 69/69]
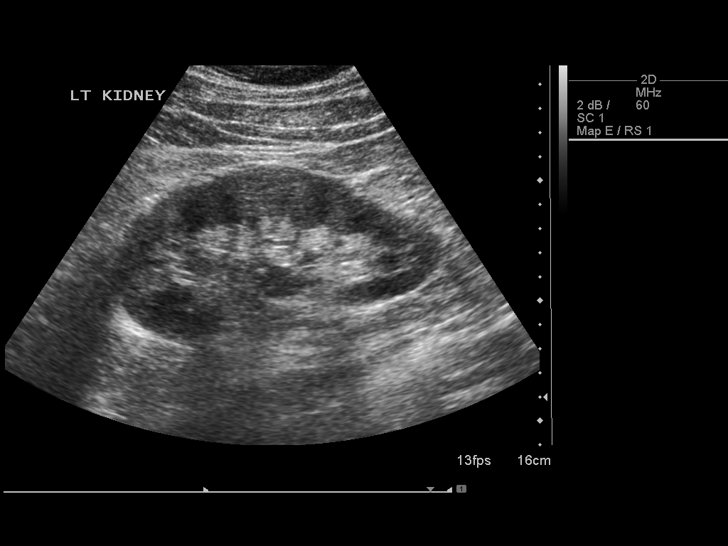

[14 of 25 positions shown; findings below may reference images not displayed]

FINDINGS: Gallbladder is visualized in multiple projections.
There are no gallstones, gallbladder wall thickening, or
pericholecystic fluid collection.  There is no intrahepatic, common
hepatic, common bile duct dilatation.  Pancreas appears within
normal limits.

Liver is enlarged, measuring 20.3 cm in length.  Liver shows
increased echogenicity consistent with fatty change.  No focal
liver lesions are identified.

Spleen is normal in size and homogeneous in echotexture.  Kidneys
appear normal bilaterally.  There is no ascites.  Aorta is
nonaneurysmal.  Inferior vena cava appears normal.
CONCLUSION: Enlarged liver with fatty change.  While no focal
liver lesions are identified, it must be cautioned that the
sensitivity of ultrasound for more subtle liver lesions is
diminished given underlying fatty change.  Study otherwise
unremarkable.
